# Patient Record
Sex: Male | Born: 1970 | Race: Black or African American | Hispanic: No | Marital: Married | Smoking: Current some day smoker
Health system: Southern US, Community
[De-identification: ages and names within clinical notes are randomized; demographics above are authoritative.]

## PROBLEM LIST (undated history)

## (undated) DIAGNOSIS — N452 Orchitis: Secondary | ICD-10-CM

## (undated) DIAGNOSIS — D619 Aplastic anemia, unspecified: Secondary | ICD-10-CM

## (undated) HISTORY — DX: Aplastic anemia, unspecified: D61.9

## (undated) HISTORY — DX: Orchitis: N45.2

---

## 1998-04-01 ENCOUNTER — Emergency Department (HOSPITAL_COMMUNITY): Admission: EM | Admit: 1998-04-01 | Discharge: 1998-04-01 | Payer: Self-pay | Admitting: Emergency Medicine

## 1999-03-15 ENCOUNTER — Emergency Department (HOSPITAL_COMMUNITY): Admission: EM | Admit: 1999-03-15 | Discharge: 1999-03-15 | Payer: Self-pay | Admitting: Emergency Medicine

## 2004-12-04 ENCOUNTER — Ambulatory Visit: Payer: Self-pay | Admitting: Oncology

## 2005-12-01 ENCOUNTER — Ambulatory Visit: Payer: Self-pay | Admitting: Oncology

## 2006-09-02 ENCOUNTER — Ambulatory Visit: Payer: Self-pay | Admitting: Oncology

## 2006-11-27 ENCOUNTER — Ambulatory Visit: Payer: Self-pay | Admitting: Oncology

## 2006-12-02 LAB — CBC WITH DIFFERENTIAL/PLATELET
BASO%: 0.4 % (ref 0.0–2.0)
Basophils Absolute: 0 10*3/uL (ref 0.0–0.1)
EOS%: 0.4 % (ref 0.0–7.0)
HCT: 47.8 % (ref 38.7–49.9)
HGB: 16.3 g/dL (ref 13.0–17.1)
LYMPH%: 24 % (ref 14.0–48.0)
MCH: 32.9 pg (ref 28.0–33.4)
MCHC: 34 g/dL (ref 32.0–35.9)
MCV: 96.9 fL (ref 81.6–98.0)
MONO%: 5.5 % (ref 0.0–13.0)
NEUT%: 69.7 % (ref 40.0–75.0)
Platelets: 298 10*3/uL (ref 145–400)

## 2007-04-04 ENCOUNTER — Ambulatory Visit: Payer: Self-pay | Admitting: Oncology

## 2007-04-04 LAB — MORPHOLOGY

## 2007-04-04 LAB — CBC WITH DIFFERENTIAL/PLATELET
Basophils Absolute: 0 10*3/uL (ref 0.0–0.1)
Eosinophils Absolute: 0.1 10*3/uL (ref 0.0–0.5)
HGB: 15.6 g/dL (ref 13.0–17.1)
LYMPH%: 33.6 % (ref 14.0–48.0)
MCV: 93.6 fL (ref 81.6–98.0)
MONO%: 9.2 % (ref 0.0–13.0)
NEUT#: 3.4 10*3/uL (ref 1.5–6.5)
Platelets: 294 10*3/uL (ref 145–400)

## 2007-11-30 ENCOUNTER — Ambulatory Visit: Payer: Self-pay | Admitting: Oncology

## 2008-01-12 ENCOUNTER — Ambulatory Visit: Payer: Self-pay | Admitting: Oncology

## 2008-01-16 LAB — MORPHOLOGY
PLT EST: ADEQUATE
RBC Comments: NORMAL

## 2008-01-16 LAB — CBC WITH DIFFERENTIAL/PLATELET
Basophils Absolute: 0 10*3/uL (ref 0.0–0.1)
EOS%: 0.6 % (ref 0.0–7.0)
Eosinophils Absolute: 0 10*3/uL (ref 0.0–0.5)
HGB: 16 g/dL (ref 13.0–17.1)
MCH: 31.1 pg (ref 28.0–33.4)
MONO%: 7.3 % (ref 0.0–13.0)
NEUT#: 4.3 10*3/uL (ref 1.5–6.5)
RBC: 5.13 10*6/uL (ref 4.20–5.71)
RDW: 13.6 % (ref 11.2–14.6)
lymph#: 1.5 10*3/uL (ref 0.9–3.3)

## 2008-01-16 LAB — COMPREHENSIVE METABOLIC PANEL
ALT: 21 U/L (ref 0–53)
AST: 17 U/L (ref 0–37)
Alkaline Phosphatase: 70 U/L (ref 39–117)
BUN: 12 mg/dL (ref 6–23)
Calcium: 9.4 mg/dL (ref 8.4–10.5)
Chloride: 105 mEq/L (ref 96–112)
Creatinine, Ser: 1.25 mg/dL (ref 0.40–1.50)

## 2009-01-11 ENCOUNTER — Ambulatory Visit: Payer: Self-pay | Admitting: Oncology

## 2009-05-16 ENCOUNTER — Ambulatory Visit: Payer: Self-pay | Admitting: Oncology

## 2009-05-20 LAB — CBC WITH DIFFERENTIAL/PLATELET
Basophils Absolute: 0 10*3/uL (ref 0.0–0.1)
EOS%: 0.5 % (ref 0.0–7.0)
HCT: 46.3 % (ref 38.4–49.9)
HGB: 16 g/dL (ref 13.0–17.1)
LYMPH%: 27.6 % (ref 14.0–49.0)
MCH: 33.4 pg (ref 27.2–33.4)
MCV: 96.3 fL (ref 79.3–98.0)
MONO%: 8.4 % (ref 0.0–14.0)
NEUT%: 63.1 % (ref 39.0–75.0)

## 2009-05-20 LAB — COMPREHENSIVE METABOLIC PANEL
AST: 16 U/L (ref 0–37)
Albumin: 4.2 g/dL (ref 3.5–5.2)
BUN: 9 mg/dL (ref 6–23)
Calcium: 9.2 mg/dL (ref 8.4–10.5)
Chloride: 103 mEq/L (ref 96–112)
Glucose, Bld: 91 mg/dL (ref 70–99)
Potassium: 3.7 mEq/L (ref 3.5–5.3)

## 2009-05-20 LAB — LIPID PANEL
LDL Cholesterol: 155 mg/dL — ABNORMAL HIGH (ref 0–99)
Total CHOL/HDL Ratio: 3.8 Ratio
VLDL: 12 mg/dL (ref 0–40)

## 2010-05-16 ENCOUNTER — Ambulatory Visit: Payer: Self-pay | Admitting: Oncology

## 2010-05-20 LAB — CBC WITH DIFFERENTIAL/PLATELET
BASO%: 0.5 % (ref 0.0–2.0)
Basophils Absolute: 0 10*3/uL (ref 0.0–0.1)
EOS%: 0.7 % (ref 0.0–7.0)
HGB: 15.8 g/dL (ref 13.0–17.1)
MCH: 33.6 pg — ABNORMAL HIGH (ref 27.2–33.4)
MCHC: 34.6 g/dL (ref 32.0–36.0)
MCV: 97.1 fL (ref 79.3–98.0)
MONO%: 7.7 % (ref 0.0–14.0)
RBC: 4.71 10*6/uL (ref 4.20–5.82)
RDW: 13.7 % (ref 11.0–14.6)

## 2010-05-20 LAB — COMPREHENSIVE METABOLIC PANEL
Albumin: 3.9 g/dL (ref 3.5–5.2)
BUN: 11 mg/dL (ref 6–23)
CO2: 29 mEq/L (ref 19–32)
Glucose, Bld: 104 mg/dL — ABNORMAL HIGH (ref 70–99)
Sodium: 138 mEq/L (ref 135–145)
Total Bilirubin: 1 mg/dL (ref 0.3–1.2)
Total Protein: 7.2 g/dL (ref 6.0–8.3)

## 2010-05-20 LAB — LIPID PANEL
Cholesterol: 214 mg/dL — ABNORMAL HIGH (ref 0–200)
Triglycerides: 42 mg/dL (ref <150)

## 2010-05-20 LAB — MORPHOLOGY

## 2012-03-30 DIAGNOSIS — N452 Orchitis: Secondary | ICD-10-CM

## 2012-03-30 HISTORY — DX: Orchitis: N45.2

## 2012-04-25 ENCOUNTER — Emergency Department (HOSPITAL_COMMUNITY): Payer: Managed Care, Other (non HMO)

## 2012-04-25 ENCOUNTER — Emergency Department (HOSPITAL_COMMUNITY)
Admission: EM | Admit: 2012-04-25 | Discharge: 2012-04-25 | Disposition: A | Discharge status: Home / Self Care | Payer: Managed Care, Other (non HMO) | Attending: Emergency Medicine | Admitting: Emergency Medicine

## 2012-04-25 ENCOUNTER — Encounter (HOSPITAL_COMMUNITY): Payer: Self-pay | Admitting: Emergency Medicine

## 2012-04-25 DIAGNOSIS — F17200 Nicotine dependence, unspecified, uncomplicated: Secondary | ICD-10-CM | POA: Insufficient documentation

## 2012-04-25 DIAGNOSIS — N453 Epididymo-orchitis: Secondary | ICD-10-CM | POA: Insufficient documentation

## 2012-04-25 LAB — URINALYSIS, ROUTINE W REFLEX MICROSCOPIC
Ketones, ur: NEGATIVE mg/dL
Nitrite: NEGATIVE
Specific Gravity, Urine: 1.026 (ref 1.005–1.030)
Urobilinogen, UA: 1 mg/dL (ref 0.0–1.0)
pH: 7.5 (ref 5.0–8.0)

## 2012-04-25 LAB — URINE MICROSCOPIC-ADD ON

## 2012-04-25 MED ORDER — CEFTRIAXONE SODIUM 250 MG IJ SOLR
250.0000 mg | Freq: Once | INTRAMUSCULAR | Status: AC
  Administered 2012-04-25: 250 mg via INTRAMUSCULAR
  Filled 2012-04-25: qty 250

## 2012-04-25 MED ORDER — LEVOFLOXACIN 750 MG PO TABS: 750.0000 mg | ORAL_TABLET | Freq: Every day | ORAL | Status: DC

## 2012-04-25 MED ORDER — OXYCODONE-ACETAMINOPHEN 5-325 MG PO TABS: 1.0000 | ORAL_TABLET | ORAL | Status: AC | PRN

## 2012-04-25 MED ORDER — LIDOCAINE HCL 1 % IJ SOLN
INTRAMUSCULAR | Status: AC
  Administered 2012-04-25: 20 mL
  Filled 2012-04-25: qty 20

## 2012-04-25 MED ORDER — LEVOFLOXACIN 500 MG PO TABS
750.0000 mg | ORAL_TABLET | Freq: Every day | ORAL | Status: DC
  Administered 2012-04-25: 750 mg via ORAL
  Filled 2012-04-25: qty 2

## 2012-04-25 MED ORDER — DOXYCYCLINE HYCLATE 100 MG PO TABS
100.0000 mg | ORAL_TABLET | Freq: Once | ORAL | Status: AC
  Administered 2012-04-25: 100 mg via ORAL
  Filled 2012-04-25: qty 1

## 2012-04-25 NOTE — Discharge Instructions (Signed)
Orchitis Orchitis is an infection of the testicle of usually sudden onset (happens quickly). It may be viral or bacterial (caused by germs). Usually with this illness there is generalized malaise (not feeling well) and fever. There is also pain. There is usually tenderness and swelling of the scrotum and testicle. DIAGNOSIS  Your caregiver will perform an exam to make sure there is not another reason for the pain in your testicle. A rectal exam may be done to find out if the prostate is swollen and tender. Blood work may be done to see if your white blood cell count is elevated. This can help determine if an infection is viral or bacterial. A urinalysis can also determine what type of infection is present. Most bacterial infections can be treated with antibiotics (medications which kill germs). LET YOUR CAREGIVER KNOW ABOUT:  Allergies.   Medications taken including herbs, eye drops, over the counter medications, and creams.   Use of steroids (by mouth or creams).   Previous problems with anesthetics or novocaine.   Previous prostate infections.   History of blood clots (thrombophlebitis).   History of bleeding or blood problems.   Previous surgery.   Previous urinary tract infection.   Other health problems.  HOME CARE INSTRUCTIONS   Apply cold packs to the scrotal area for twenty minutes, four times per day or as needed.   A scrotal support may be helpful. Keep a small pillow or support under your testicles while lying or sitting down.   Only take over-the-counter or prescription medicines for pain, discomfort, or fever as directed by your caregiver.   Take all medications, including antibiotics, as directed. Take the antibiotics for the full prescribed length of time even if you are feeling better.  SEEK IMMEDIATE MEDICAL CARE IF:   Your redness, swelling, or pain in the testicle increases or is not getting better.   You have a fever.   You have pain not relieved with  medicines.   You have any worsening of any symptoms (problems) that originally brought you in for medical care.  Document Released: 11/13/2000 Document Revised: 11/05/2011 Document Reviewed: 11/16/2005 [facility] Patient Information 2012 [place], [place].  Please take all antibiotics as prescribed. Use pain medicine as needed. Call Dr. Ellin Goodie office tomorrow morning for followup this week.

## 2012-04-25 NOTE — ED Notes (Signed)
Pt returned from [place]. Encouraged to provide urine specimen.

## 2012-04-25 NOTE — ED Notes (Signed)
Pt was lifting 4 days ago and had sudden lower abdominal pain to testicles. States has "lump" on right testicle.

## 2012-04-26 LAB — GC/CHLAMYDIA PROBE AMP, GENITAL: Chlamydia, DNA Probe: NEGATIVE

## 2012-04-27 ENCOUNTER — Emergency Department (HOSPITAL_COMMUNITY)
Admission: EM | Admit: 2012-04-27 | Discharge: 2012-04-27 | Disposition: A | Discharge status: Home / Self Care | Payer: Managed Care, Other (non HMO) | Attending: Emergency Medicine | Admitting: Emergency Medicine

## 2012-04-27 ENCOUNTER — Encounter (HOSPITAL_COMMUNITY): Payer: Self-pay | Admitting: Emergency Medicine

## 2012-04-27 DIAGNOSIS — Z0289 Encounter for other administrative examinations: Secondary | ICD-10-CM | POA: Insufficient documentation

## 2012-04-27 DIAGNOSIS — N453 Epididymo-orchitis: Secondary | ICD-10-CM | POA: Insufficient documentation

## 2012-04-27 DIAGNOSIS — F17200 Nicotine dependence, unspecified, uncomplicated: Secondary | ICD-10-CM | POA: Insufficient documentation

## 2012-04-27 DIAGNOSIS — N452 Orchitis: Secondary | ICD-10-CM

## 2012-04-27 NOTE — ED Notes (Signed)
Pt presenting to ed with c/o seen here on Monday for infection in his testicles. Pt states he went back to work today but was unable to perform his job and he needs a note to be out for the rest of the week. 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 Pt is alert and oriented at this time

## 2012-04-27 NOTE — Discharge Instructions (Signed)
Orchitis Orchitis is an infection of the testicle of usually sudden onset (happens quickly). It may be viral or bacterial (caused by germs). Usually with this illness there is generalized malaise (not feeling well) and fever. There is also pain. There is usually tenderness and swelling of the scrotum and testicle. DIAGNOSIS  Your caregiver will perform an exam to make sure there is not another reason for the pain in your testicle. A rectal exam may be done to find out if the prostate is swollen and tender. Blood work may be done to see if your white blood cell count is elevated. This can help determine if an infection is viral or bacterial. A urinalysis can also determine what type of infection is present. Most bacterial infections can be treated with antibiotics (medications which kill germs). LET YOUR CAREGIVER KNOW ABOUT:  Allergies.   Medications taken including herbs, eye drops, over the counter medications, and creams.   Use of steroids (by mouth or creams).   Previous problems with anesthetics or novocaine.   Previous prostate infections.   History of blood clots (thrombophlebitis).   History of bleeding or blood problems.   Previous surgery.   Previous urinary tract infection.   Other health problems.  HOME CARE INSTRUCTIONS   Apply cold packs to the scrotal area for twenty minutes, four times per day or as needed.   A scrotal support may be helpful. Keep a small pillow or support under your testicles while lying or sitting down.   Only take over-the-counter or prescription medicines for pain, discomfort, or fever as directed by your caregiver.   Take all medications, including antibiotics, as directed. Take the antibiotics for the full prescribed length of time even if you are feeling better.  SEEK IMMEDIATE MEDICAL CARE IF:   Your redness, swelling, or pain in the testicle increases or is not getting better.   You have a fever.   You have pain not relieved with  medicines.   You have any worsening of any symptoms (problems) that originally brought you in for medical care.  Document Released: 11/13/2000 Document Revised: 11/05/2011 Document Reviewed: 11/16/2005 [employer] Patient Information 2012 [employer]. 

## 2012-04-27 NOTE — ED Provider Notes (Signed)
History     CSN: [id number]  Arrival date & time 04/27/12  0930   First MD Initiated Contact with Patient 04/27/12 [phone]      Chief Complaint  Patient presents with  . Re-evaluation  . Letter for School/Work    (Consider location/radiation/quality/duration/timing/severity/associated sxs/prior treatment) HPI  41 year old male presents requesting for note. Patient was recently diagnosed with having epidydimo-orchitis 2 days ago and was given Levaquin as treatment. Pt went back to work today but was unable to perform his work due to increasing testicular pain with heavy lifting. He does admits to perform heavy lifting at his [employer] job. Denies fever, rash, penile discharge. His symptoms is improving otherwise.  History reviewed. No pertinent past medical history.  No past surgical history on file.  No family history on file.  History  Substance Use Topics  . Smoking status: Current Some Day Smoker    Types: Cigars  . Smokeless tobacco: Not on file  . Alcohol Use: Yes     occasionally      Review of Systems  Constitutional: Negative for fever.  Cardiovascular: Negative for leg swelling.  Genitourinary: Positive for scrotal swelling and testicular pain. Negative for flank pain and penile pain.  Skin: Negative for rash.    Allergies  Review of patient's allergies indicates no known allergies.  Home Medications   Current Outpatient Rx  Name Route Sig Dispense Refill  . DICLOFENAC SODIUM 75 MG PO TBEC Oral Take 75 mg by mouth 2 (two) times daily as needed. For pain.    Marland Kitchen LEVOFLOXACIN 750 MG PO TABS Oral Take 750 mg by mouth daily. PT'S ON DAY 2 OF THERAPY    . ADULT MULTIVITAMIN W/MINERALS CH Oral Take 1 tablet by mouth daily.    . OXYCODONE-ACETAMINOPHEN 5-325 MG PO TABS Oral Take 1 tablet by mouth every 4 (four) hours as needed for pain. 15 tablet 0    BP 120/65  Pulse 93  Temp(Src) 99.2 F (37.3 C) (Oral)  Resp 20  SpO2 99%  Physical Exam  Nursing note  and vitals reviewed. Constitutional: He appears well-developed and well-nourished. No distress.  HENT:  Head: Atraumatic.  Eyes: Conjunctivae are normal.  Neck: Normal range of motion. Neck supple.  Abdominal: Soft. There is no tenderness. Hernia confirmed negative in the right inguinal area and confirmed negative in the left inguinal area.  Genitourinary: Penis normal. Right testis shows swelling and tenderness. Left testis shows swelling and tenderness.  Musculoskeletal: Normal range of motion.  Neurological: He is alert.  Skin: Skin is warm. No rash noted.    ED Course  Procedures (including critical care time)  Labs Reviewed - No data to display US Scrotum  04/25/2012  *RADIOLOGY REPORT*  Clinical Data:  Bilateral testicular swelling and pain.  SCROTAL ULTRASOUND DOPPLER ULTRASOUND OF THE TESTICLES  Technique: Complete ultrasound examination of the testicles, epididymis, and other scrotal structures was performed.  Color and spectral Doppler ultrasound were also utilized to evaluate blood flow to the testicles.  Comparison:  None  Findings:  Right testis:  4.3 x 2.2 x 2.9 cm.  Increased vascularity with microlithiasis.  Left testis:  3.9 x 2.8 x 3.0 cm.  Increased vascularity.  Right epididymis: Very prominent and heterogeneous with increased vascularity.  Left epididymis:  Very prominent and heterogeneous with increased vascularity.  Hydocele:  Small bilateral hydroceles.  Varicocele:  No varicoceles.  Pulsed Doppler interrogation of both testes demonstrates low resistance flow bilaterally.  The scan of the scrotum is  thickened.  IMPRESSION: Findings of bilateral epididymo-orchitis.  No abscesses at this time.  No evidence of testicular torsion.  Original Report Authenticated By: Gwynn Burly, M.D.   [place] Art/ven Flow Abd Pelv Doppler  04/25/2012  *RADIOLOGY REPORT*  Clinical Data:  Bilateral testicular swelling and pain.  SCROTAL ULTRASOUND DOPPLER ULTRASOUND OF THE TESTICLES  Technique:  Complete ultrasound examination of the testicles, epididymis, and other scrotal structures was performed.  Color and spectral Doppler ultrasound were also utilized to evaluate blood flow to the testicles.  Comparison:  None  Findings:  Right testis:  4.3 x 2.2 x 2.9 cm.  Increased vascularity with microlithiasis.  Left testis:  3.9 x 2.8 x 3.0 cm.  Increased vascularity.  Right epididymis: Very prominent and heterogeneous with increased vascularity.  Left epididymis:  Very prominent and heterogeneous with increased vascularity.  Hydocele:  Small bilateral hydroceles.  Varicocele:  No varicoceles.  Pulsed Doppler interrogation of both testes demonstrates low resistance flow bilaterally.  The scan of the scrotum is thickened.  IMPRESSION: Findings of bilateral epididymo-orchitis.  No abscesses at this time.  No evidence of testicular torsion.  Original Report Authenticated By: Gwynn Burly, M.D.     No diagnosis found.    MDM  Pt request for work note to be out for this week due to epidydimo-orchitis.  Sts Levaquin has helped significantly.  On exam, scrotal edematous, tender to palpation. Otherwise, no sig findings.  Afebrile.    Will offer work note.     Vital signs and medical records were reviewed and considered.  Labs and imaging were reviewed by me.      Fayrene Helper, PA-C 04/27/12 1003  Fayrene Helper, PA-C 04/27/12 1010

## 2012-04-27 NOTE — ED Notes (Signed)
Pt presenting to ed with c/o needing a note for the rest of the week for work. Pt states he attempted to return to work today and could not perform his duties.

## 2012-04-28 NOTE — ED Provider Notes (Signed)
Medical screening examination/treatment/procedure(s) were performed by non-physician practitioner and as supervising physician I was immediately available for consultation/collaboration.    Stancil L Lavi Sheehan, MD 04/28/12 0709 

## 2012-05-05 ENCOUNTER — Ambulatory Visit: Payer: Self-pay | Admitting: Oncology

## 2012-05-05 ENCOUNTER — Other Ambulatory Visit: Payer: Self-pay | Admitting: Lab

## 2012-05-09 NOTE — ED Provider Notes (Signed)
History     CSN: [id number]  Arrival date & time 04/25/12  1004   First MD Initiated Contact with Patient 04/25/12 1009      Chief Complaint  Patient presents with  . Testicle Pain    (Consider location/radiation/quality/duration/timing/severity/associated sxs/prior treatment) HPI  Patient complaining of pain and swelling of right testicle. This has been present for 4 days and is worsening. He has not had fever or chills. He denies urethral discharge or exposure to STDs. He has not any urinary tract infection symptoms. He has not had similar symptoms in the past. Pain is constant in nature without radiation it is a throbbing quality present for 4 days and is moderate to severe. He has no associated symptoms or prior treatment the  History reviewed. No pertinent past medical history.  History reviewed. No pertinent past surgical history.  History reviewed. No pertinent family history.  History  Substance Use Topics  . Smoking status: Current Some Day Smoker    Types: Cigars  . Smokeless tobacco: Not on file  . Alcohol Use: Yes     occasionally      Review of Systems  All other systems reviewed and are negative.    Allergies  Review of patient's allergies indicates no known allergies.  Home Medications   Current Outpatient Rx  Name Route Sig Dispense Refill  . DICLOFENAC SODIUM 75 MG PO TBEC Oral Take 75 mg by mouth 2 (two) times daily as needed. For pain.    . ADULT MULTIVITAMIN W/MINERALS CH Oral Take 1 tablet by mouth daily.      BP 108/62  Pulse 94  Temp(Src) 99.3 F (37.4 C) (Oral)  Resp 18  SpO2 99%  Physical Exam  Nursing note and vitals reviewed. Constitutional: He appears well-developed and well-nourished.  HENT:  Head: Normocephalic and atraumatic.  Eyes: Conjunctivae are normal. Pupils are equal, round, and reactive to light.  Neck: Normal range of motion. Neck supple.  Cardiovascular: Normal rate.   Abdominal: Hernia confirmed negative in  the right inguinal area and confirmed negative in the left inguinal area.  Genitourinary: Prostate normal and penis normal. Right testis shows swelling and tenderness. Left testis shows swelling and tenderness.    ED Course  Procedures (including critical care time)  Labs Reviewed  URINALYSIS, ROUTINE W REFLEX MICROSCOPIC - Abnormal; Notable for the following:    APPearance CLOUDY (*)    Protein, ur 30 (*)    Leukocytes, UA SMALL (*)    All other components within normal limits  GC/CHLAMYDIA PROBE AMP, GENITAL  URINE MICROSCOPIC-ADD ON  URINE CULTURE  LAB REPORT - SCANNED   No results found.   1. Epididymo-orchitis       MDM  Patient to be discharged home on antibiotics. Discussed the patient's care with urology and he will followup in the next 3-4 days.       Hilario Quarry, MD 05/09/12 [id number]

## 2012-05-26 ENCOUNTER — Telehealth: Payer: Self-pay | Admitting: *Deleted

## 2012-05-30 ENCOUNTER — Ambulatory Visit (HOSPITAL_BASED_OUTPATIENT_CLINIC_OR_DEPARTMENT_OTHER): Payer: Managed Care, Other (non HMO) | Admitting: Oncology

## 2012-05-30 ENCOUNTER — Telehealth: Payer: Self-pay | Admitting: *Deleted

## 2012-05-30 ENCOUNTER — Encounter: Payer: Self-pay | Admitting: Oncology

## 2012-05-30 VITALS — BP 121/72 | HR 82 | Temp 98.9°F | Ht 67.5 in | Wt 157.4 lb

## 2012-05-30 DIAGNOSIS — N453 Epididymo-orchitis: Secondary | ICD-10-CM

## 2012-05-30 DIAGNOSIS — N452 Orchitis: Secondary | ICD-10-CM

## 2012-05-30 DIAGNOSIS — D619 Aplastic anemia, unspecified: Secondary | ICD-10-CM

## 2012-05-30 NOTE — Telephone Encounter (Signed)
Gave patient appointment for lab only on 06-06-2012 gave patient appointment for 05-23-2013 lab only appointment 05-30-2013 at 4:30pm per patient request gave ebony the patient's fmla paperwork also

## 2012-05-30 NOTE — Progress Notes (Signed)
ID: Ria Clock   DOB: 1971/09/15  MR#: [id number]  WUJ#:[id number]  INTERVAL HISTORY: Karen Kitchens returns today for followup of his remote aplastic anemia. The interval history is generally unremarkable.  REVIEW OF SYSTEMS:  He was seen in the emergency room for orchitis, and treated with herbal care and and Levaquin. He is feeling fine right now. He has had no bleeding, fever, unexplained fatigue or weight loss, or other systemic symptoms since the last visit here. A detailed review of systems was otherwise noncontributory  PAST MEDICAL HISTORY: Past Medical History  Diagnosis Date  . Aplastic anemia, unspecified     PAST SURGICAL HISTORY: No past surgical history on file.  FAMILY HISTORY No family history on file.  his father died 12-10-11 from complications of Alzheimer's disease. His mother is now 51 years old. He has a half-brother who is in his early 38s. He has no sisters. There is no history of cancer or other blood problems in the family.  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 His wife of 10 years, Ephriam Knuckles, works in [place]. Their 2 daughters are [place] and [place]. The patient attends [employer]. [employer]   ADVANCED DIRECTIVES:  HEALTH MAINTENANCE: History  Substance Use Topics  . Smoking status: Current Some Day Smoker    Types: Cigars  . Smokeless tobacco: Not on file  . Alcohol Use: Yes     occasionally     Colonoscopy:  PSA:  Bone density:  Lipid panel:  No Known Allergies  Current Outpatient Prescriptions  Medication Sig Dispense Refill  . Multiple Vitamin (MULITIVITAMIN WITH MINERALS) TABS Take 1 tablet by mouth daily.      . diclofenac (VOLTAREN) 75 MG EC tablet Take 75 mg by mouth 2 (two) times daily as needed. For pain.        OBJECTIVE: Young-appearing African American male in no acute distress Filed Vitals:   05/30/12 1637  BP: 121/72  Pulse: 82  Temp: 98.9 F (37.2 C)     Body mass index  is 24.29 kg/(m^2).    ECOG FS: 0  Sclerae unicteric Oropharynx clear No cervical or supraclavicular adenopathy Lungs no rales or rhonchi Heart regular rate and rhythm Abd benign MSK no focal spinal tenderness, no peripheral edema Neuro: nonfocal  LAB RESULTS: Lab Results  Component Value Date   WBC 5.7 05/20/2010   NEUTROABS 3.6 05/20/2010   HGB 15.8 05/20/2010   HCT 45.7 05/20/2010   MCV 97.1 05/20/2010   PLT 288 05/20/2010      Chemistry      Component Value Date/Time   NA 138 05/20/2010 1113   K 3.4* 05/20/2010 1113   CL 105 05/20/2010 1113   CO2 29 05/20/2010 1113   BUN 11 05/20/2010 1113   CREATININE 1.46 05/20/2010 1113      Component Value Date/Time   CALCIUM 9.3 05/20/2010 1113   ALKPHOS 58 05/20/2010 1113   AST 28 05/20/2010 1113   ALT 29 05/20/2010 1113   BILITOT 1.0 05/20/2010 1113       No results found for this basename: LABCA2    No components found with this basename: [id number]    No results found for this basename: INR:1;PROTIME:1 in the last 168 hours  Urinalysis    Component Value Date/Time   COLORURINE YELLOW 04/25/2012 1150   APPEARANCEUR CLOUDY* 04/25/2012 1150   LABSPEC 1.026 04/25/2012 1150   PHURINE 7.5 04/25/2012 1150   GLUCOSEU NEGATIVE 04/25/2012  1150   HGBUR NEGATIVE 04/25/2012 1150   BILIRUBINUR NEGATIVE 04/25/2012 1150   KETONESUR NEGATIVE 04/25/2012 1150   PROTEINUR 30* 04/25/2012 1150   UROBILINOGEN 1.0 04/25/2012 1150   NITRITE NEGATIVE 04/25/2012 1150   LEUKOCYTESUR SMALL* 04/25/2012 1150    STUDIES: SCROTAL ULTRASOUND  DOPPLER ULTRASOUND OF THE TESTICLES  Technique: Complete ultrasound examination of the testicles,  epididymis, and other scrotal structures was performed. Color and  spectral Doppler ultrasound were also utilized to evaluate blood  flow to the testicles.  Comparison: None  Findings:  Right testis: 4.3 x 2.2 x 2.9 cm. Increased vascularity with  microlithiasis.  Left testis: 3.9 x 2.8 x 3.0 cm. Increased vascularity.    Right epididymis: Very prominent and heterogeneous with increased  vascularity.  Left epididymis: Very prominent and heterogeneous with increased  vascularity.  Hydocele: Small bilateral hydroceles.  Varicocele: No varicoceles.  Pulsed Doppler interrogation of both testes demonstrates low  resistance flow bilaterally.  The scan of the scrotum is thickened.  IMPRESSION:  Findings of bilateral epididymo-orchitis. No abscesses at this  time. No evidence of testicular torsion.  Original Report Authenticated By: Gwynn Burly, M.D.    ASSESSMENT: 41 y.o. with a history of aplastic anemia diagnosed March of 1994, in continuing remission.   PLAN: we're going to obtain a lipid panel next Monday, which is a day he has off and therefore is able to come early to get his labs. I offered to get him a primary care physician and he will let been know if he would like me to do that. Otherwise she will see me again in one year. He knows to call for any problems that may develop before then.   Samiyyah Moffa C    05/30/2012

## 2012-05-31 ENCOUNTER — Encounter: Payer: Self-pay | Admitting: Oncology

## 2012-05-31 NOTE — Progress Notes (Signed)
Put fmla papers on nurse's desk °

## 2012-06-06 ENCOUNTER — Other Ambulatory Visit (HOSPITAL_BASED_OUTPATIENT_CLINIC_OR_DEPARTMENT_OTHER): Payer: Managed Care, Other (non HMO) | Admitting: Lab

## 2012-06-06 DIAGNOSIS — D619 Aplastic anemia, unspecified: Secondary | ICD-10-CM

## 2012-06-06 LAB — CBC WITH DIFFERENTIAL/PLATELET
Basophils Absolute: 0 10*3/uL (ref 0.0–0.1)
Eosinophils Absolute: 0.1 10*3/uL (ref 0.0–0.5)
HCT: 44.8 % (ref 38.4–49.9)
LYMPH%: 32.3 % (ref 14.0–49.0)
MONO#: 0.5 10*3/uL (ref 0.1–0.9)
NEUT#: 3.1 10*3/uL (ref 1.5–6.5)
NEUT%: 57.2 % (ref 39.0–75.0)
Platelets: 267 10*3/uL (ref 140–400)
WBC: 5.4 10*3/uL (ref 4.0–10.3)

## 2012-06-06 LAB — COMPREHENSIVE METABOLIC PANEL
CO2: 28 mEq/L (ref 19–32)
Creatinine, Ser: 1.22 mg/dL (ref 0.50–1.35)
Glucose, Bld: 96 mg/dL (ref 70–99)
Total Bilirubin: 0.6 mg/dL (ref 0.3–1.2)
Total Protein: 7 g/dL (ref 6.0–8.3)

## 2012-06-06 LAB — LIPID PANEL
Cholesterol: 213 mg/dL — ABNORMAL HIGH (ref 0–200)
Total CHOL/HDL Ratio: 3.2 Ratio
Triglycerides: 66 mg/dL (ref <150)
VLDL: 13 mg/dL (ref 0–40)

## 2012-06-15 NOTE — Telephone Encounter (Signed)
No entry noted

## 2012-07-26 ENCOUNTER — Telehealth: Payer: Self-pay | Admitting: Medical Oncology

## 2012-07-26 NOTE — Telephone Encounter (Signed)
LMOVM, per MD, labs looked perfect except for LCL.  Dr. Darnelle Catalan suggests no fried foods, decreasing red meat intake, and Dr. Darnelle Catalan will see you back in one year.  Instructed patient to call with any questions or concerns.

## 2013-05-22 ENCOUNTER — Other Ambulatory Visit: Payer: Self-pay | Admitting: *Deleted

## 2013-05-22 DIAGNOSIS — D619 Aplastic anemia, unspecified: Secondary | ICD-10-CM

## 2013-05-23 ENCOUNTER — Other Ambulatory Visit (HOSPITAL_BASED_OUTPATIENT_CLINIC_OR_DEPARTMENT_OTHER): Payer: Managed Care, Other (non HMO) | Admitting: Lab

## 2013-05-23 DIAGNOSIS — D619 Aplastic anemia, unspecified: Secondary | ICD-10-CM

## 2013-05-23 LAB — COMPREHENSIVE METABOLIC PANEL (CC13)
AST: 18 U/L (ref 5–34)
Albumin: 3.7 g/dL (ref 3.5–5.0)
Alkaline Phosphatase: 77 U/L (ref 40–150)
BUN: 10.1 mg/dL (ref 7.0–26.0)
Calcium: 9.4 mg/dL (ref 8.4–10.4)
Creatinine: 1.2 mg/dL (ref 0.7–1.3)
Potassium: 3.7 mEq/L (ref 3.5–5.1)
Total Bilirubin: 0.34 mg/dL (ref 0.20–1.20)
Total Protein: 7.3 g/dL (ref 6.4–8.3)

## 2013-05-23 LAB — CBC WITH DIFFERENTIAL/PLATELET
Basophils Absolute: 0 10*3/uL (ref 0.0–0.1)
EOS%: 0.9 % (ref 0.0–7.0)
HGB: 15.1 g/dL (ref 13.0–17.1)
LYMPH%: 24.8 % (ref 14.0–49.0)
MCH: 32.3 pg (ref 27.2–33.4)
MCV: 94.9 fL (ref 79.3–98.0)
MONO%: 7.3 % (ref 0.0–14.0)
NEUT%: 66.5 % (ref 39.0–75.0)
RDW: 13.8 % (ref 11.0–14.6)

## 2013-05-30 ENCOUNTER — Telehealth: Payer: Self-pay | Admitting: *Deleted

## 2013-05-30 ENCOUNTER — Ambulatory Visit (HOSPITAL_BASED_OUTPATIENT_CLINIC_OR_DEPARTMENT_OTHER): Payer: Managed Care, Other (non HMO) | Admitting: Oncology

## 2013-05-30 VITALS — BP 114/69 | HR 85 | Temp 97.6°F | Resp 20 | Ht 67.5 in | Wt 160.2 lb

## 2013-05-30 DIAGNOSIS — D619 Aplastic anemia, unspecified: Secondary | ICD-10-CM

## 2013-05-30 NOTE — Telephone Encounter (Signed)
appts made and printed...td 

## 2013-05-30 NOTE — Progress Notes (Signed)
ID: Hunter Jennings   DOB: 1971-07-24  MR#: [id number]  NWG#:[id number]  INTERVAL HISTORY: Karen Kitchens returns today for followup of his remote aplastic anemia. The interval history is generally unremarkable. He continues to work full-time at his very physical job where he walks all day. His daughters are now 2 and 6. His wife works for [employer].  REVIEW OF SYSTEMS: He was having some low back pain but that resolved when he got a firmer mattress. He has had no unusual headaches, visual changes, cough, phlegm production, pleurisy, change in bowel or bladder habits, chest pain or pressure, palpitations, rash, fever, or bleeding. A detailed review of systems today was entirely negative.  PAST MEDICAL HISTORY: Past Medical History  Diagnosis Date  . Aplastic anemia, unspecified   . Orchitis of both testicles may 2013    PAST SURGICAL HISTORY: No past surgical history on file.  FAMILY HISTORY No family history on file. his father died November 20, 2011 from complications of Alzheimer's disease. His mother is now 34 years old. He has a half-brother who is in his early 23s. He has no sisters. There is no history of cancer or other blood problems in the family.  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 His wife of 10 years, Ephriam Knuckles, works in [place]. Their 2 daughters are [place] and [place]. The patient attends [employer]. [employer]   ADVANCED DIRECTIVES:  HEALTH MAINTENANCE: History  Substance Use Topics  . Smoking status: Current Some Day Smoker    Types: Cigars  . Smokeless tobacco: Not on file  . Alcohol Use: Yes     Comment: occasionally     Colonoscopy:  PSA:  Bone density:  Lipid panel:  No Known Allergies  Current Outpatient Prescriptions  Medication Sig Dispense Refill  . diclofenac (VOLTAREN) 75 MG EC tablet Take 75 mg by mouth 2 (two) times daily as needed. For pain.      . Multiple Vitamin (MULITIVITAMIN WITH MINERALS) TABS Take 1  tablet by mouth daily.       No current facility-administered medications for this visit.    OBJECTIVE: Young-appearing African American male in no acute distress Filed Vitals:   05/30/13 1638  BP: 114/69  Pulse: 85  Temp: 97.6 F (36.4 C)  Resp: 20     Body mass index is 24.71 kg/(m^2).    ECOG FS: 0  Sclerae unicteric Oropharynx clear No cervical or supraclavicular adenopathy Lungs no rales or rhonchi Heart regular rate and rhythm Abd benign MSK no focal spinal tenderness, no peripheral edema Neuro: nonfocal, well oriented, pleasant affect  LAB RESULTS: Lab Results  Component Value Date   WBC 7.3 05/23/2013   NEUTROABS 4.8 05/23/2013   HGB 15.1 05/23/2013   HCT 44.3 05/23/2013   MCV 94.9 05/23/2013   PLT 289 05/23/2013      Chemistry      Component Value Date/Time   NA 138 05/23/2013 1608   NA 138 06/06/2012 0812   K 3.7 05/23/2013 1608   K 3.9 06/06/2012 0812   CL 104 05/23/2013 1608   CL 103 06/06/2012 0812   CO2 27 05/23/2013 1608   CO2 28 06/06/2012 0812   BUN 10.1 05/23/2013 1608   BUN 10 06/06/2012 0812   CREATININE 1.2 05/23/2013 1608   CREATININE 1.22 06/06/2012 0812      Component Value Date/Time   CALCIUM 9.4 05/23/2013 1608   CALCIUM 9.2 06/06/2012 0812   ALKPHOS 77 05/23/2013 1608  ALKPHOS 57 06/06/2012 0812   AST 18 05/23/2013 1608   AST 19 06/06/2012 0812   ALT 17 05/23/2013 1608   ALT 18 06/06/2012 0812   BILITOT 0.34 05/23/2013 1608   BILITOT 0.6 06/06/2012 0812       No results found for this basename: LABCA2    No components found with this basename: LABCA125    No results found for this basename: INR,  in the last 168 hours Results for ILLYA, GIENGER (MRN [id number]) as of 05/30/2013 16:39  Ref. Range 06/06/2012 08:12  Cholesterol Latest Range: 0-200 mg/dL 409 (H)  Triglycerides Latest Range: <150 mg/dL 66  HDL Latest Range: >81 mg/dL 67  LDL (calc) Latest Range: 0-99 mg/dL 191 (H)  VLDL Latest Range: 0-40 mg/dL 13  Total CHOL/HDL Ratio No range found 3.2     STUDIES: No results found.  ASSESSMENT: 42 y.o. with a history of aplastic anemia diagnosed March of 1994, treated with a brief course of steroids alone, in continuing remission.   PLAN: Reita Cliche looks fine and is very healthy. At this point, as he has turns 42, I think it would be a good idea for him to establish himself with her primary care physician, I will try to facilitate that for him. Otherwise she will see me again in one year. He knows to call for any problems that may develop before that visit.  MAGRINAT,GUSTAV C    05/30/2013

## 2013-05-31 ENCOUNTER — Telehealth: Payer: Self-pay | Admitting: *Deleted

## 2013-05-31 NOTE — Telephone Encounter (Signed)
Lm gv the info for [facility], with the number and address for the pt to get a PCP...td

## 2013-06-15 ENCOUNTER — Telehealth: Payer: Self-pay | Admitting: Oncology

## 2013-06-15 NOTE — Telephone Encounter (Signed)
Sent letter to Dr.William D. Shaw office from Dr. Darnelle Catalan

## 2013-09-06 ENCOUNTER — Telehealth: Payer: Self-pay | Admitting: *Deleted

## 2013-09-06 NOTE — Telephone Encounter (Signed)
Lm informed the pt that GCM will be out of the office on 05/31/14. gv appt for 05/28/14 to have labs@ 4pm, and ov on 06/04/14@ 4p. Made pt aware that i will mail a letter/avs...td

## 2014-02-15 IMAGING — US US ART/VEN ABD/PELV/SCROTUM DOPPLER LTD
1 series · 14 of 25 positions shown · non-contrast
Comparison: None

CLINICAL DATA: Bilateral testicular swelling and pain.

SCROTAL ULTRASOUND
DOPPLER ULTRASOUND OF THE TESTICLES
TECHNIQUE: Complete ultrasound examination of the testicles,
epididymis, and other scrotal structures was performed.  Color and
spectral Doppler ultrasound were also utilized to evaluate blood
flow to the testicles.

[Series 1: us art/ven abd/pelv/scrotum doppler ltd · 0.08mm/px · 14 of 78 slices shown]
[im 1/78]
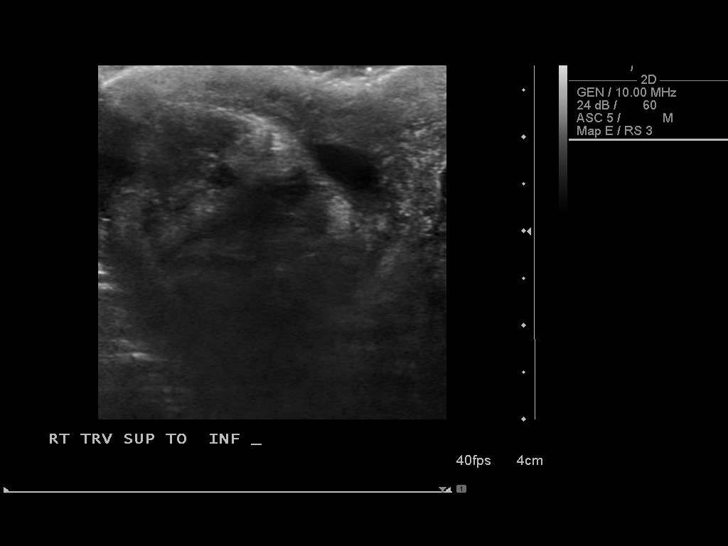
[im 7/78]
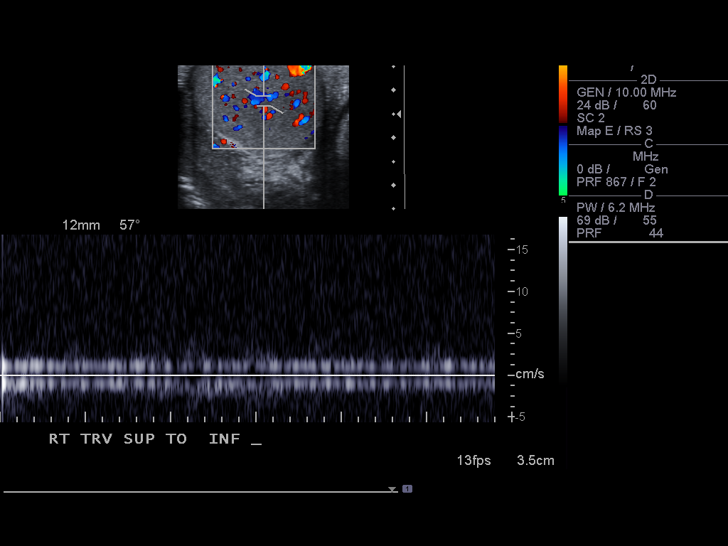
[im 13/78]
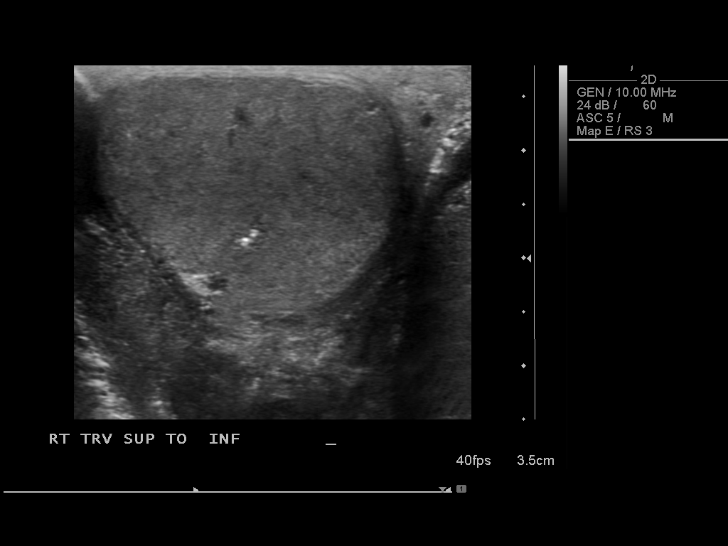
[im 20/78]
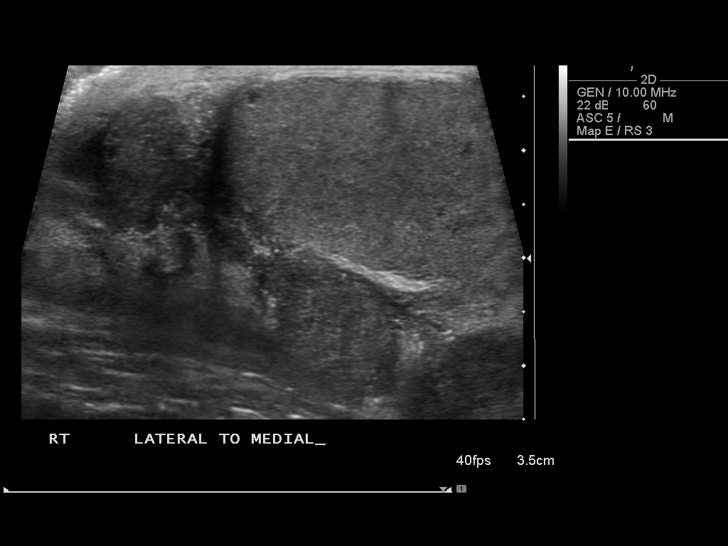
[im 26/78]
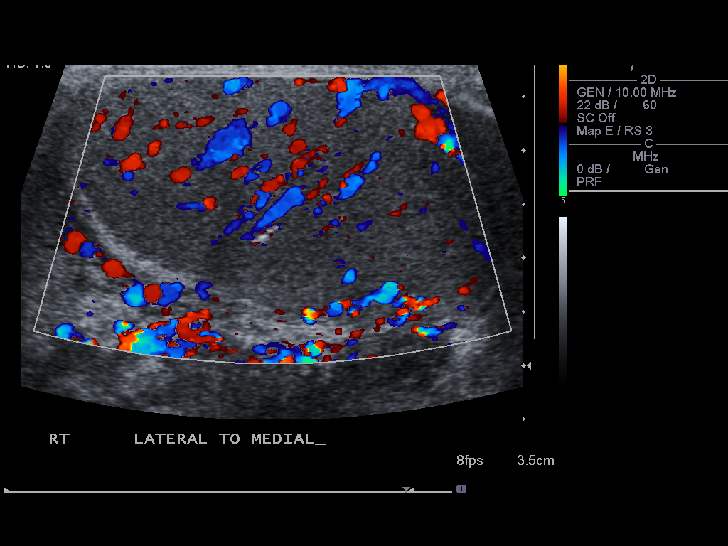
[im 29/78]
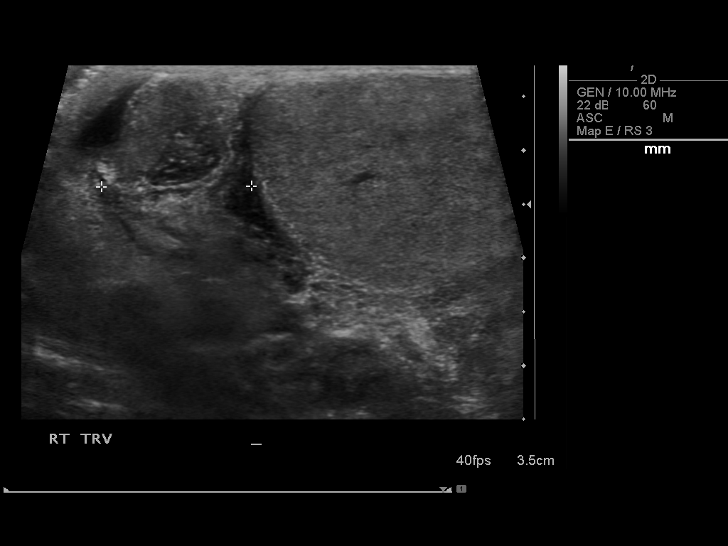
[im 36/78]
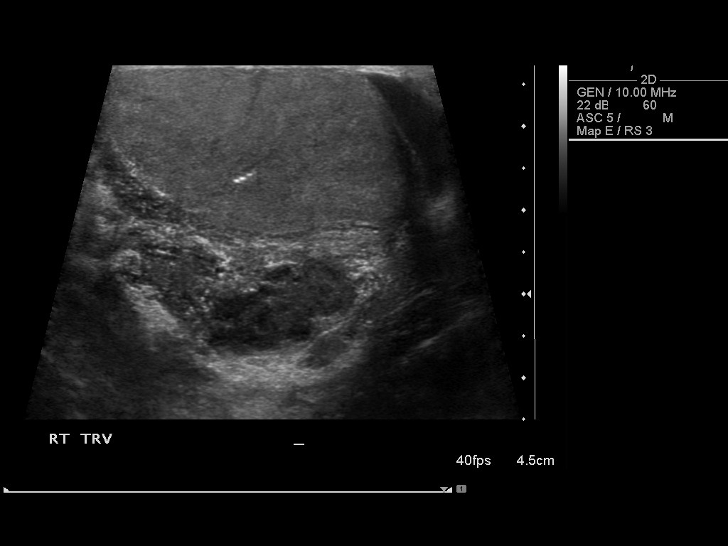
[im 42/78]
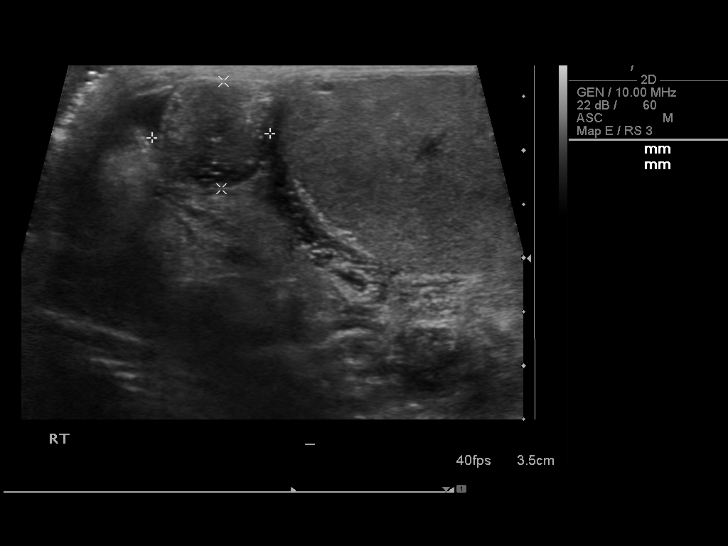
[im 49/78]
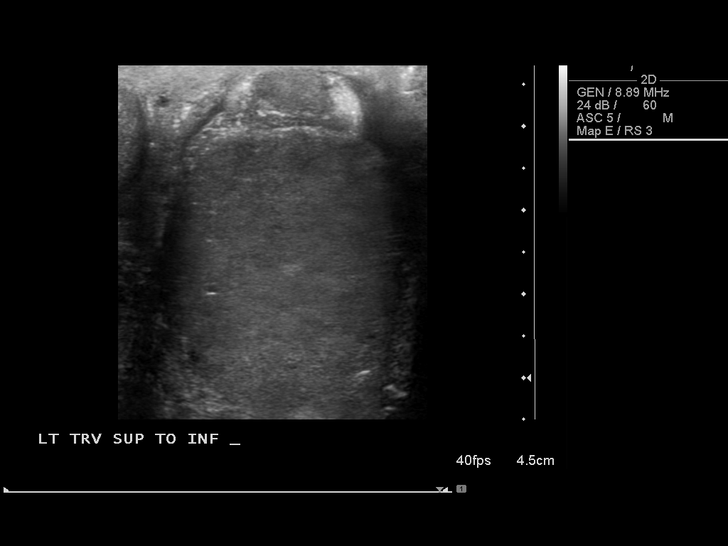
[im 52/78]
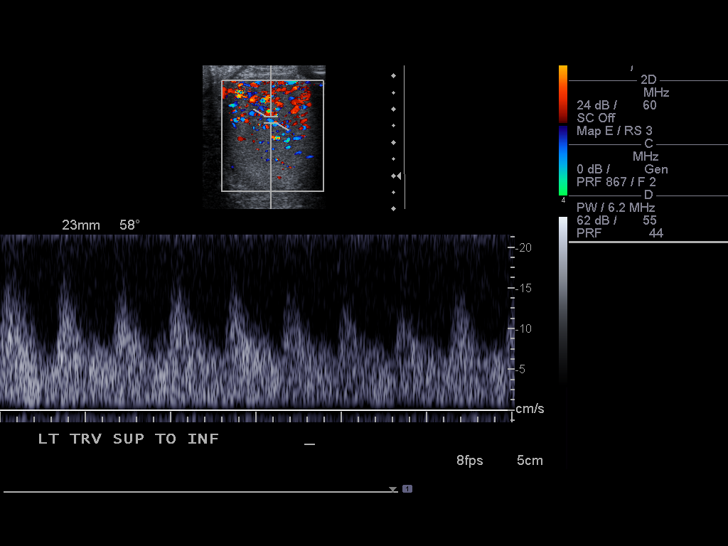
[im 58/78]
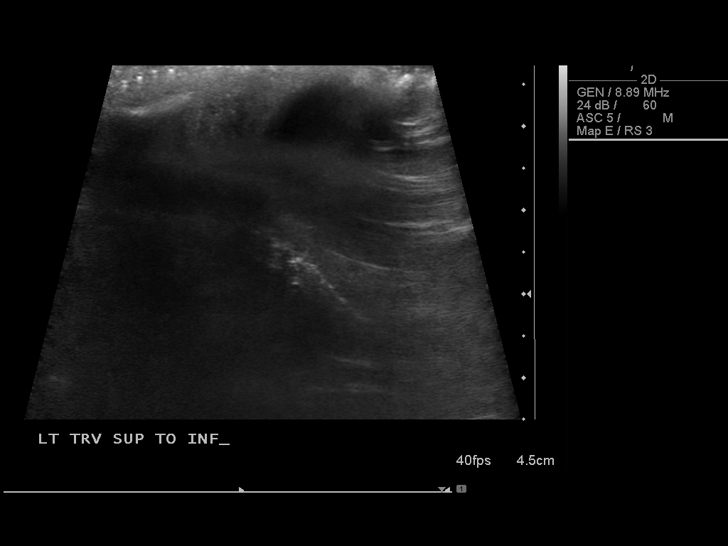
[im 65/78]
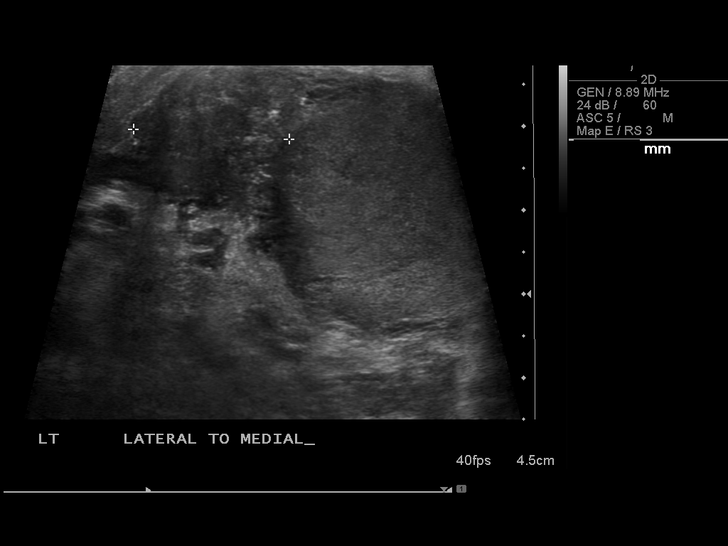
[im 71/78]
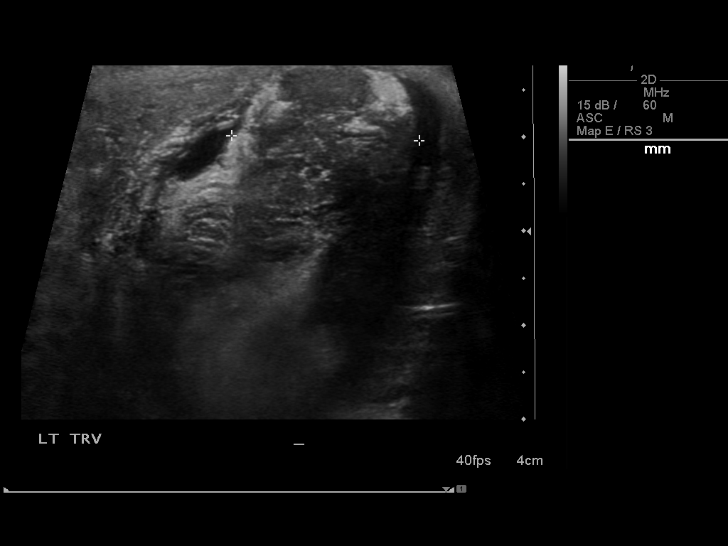
[im 78/78]
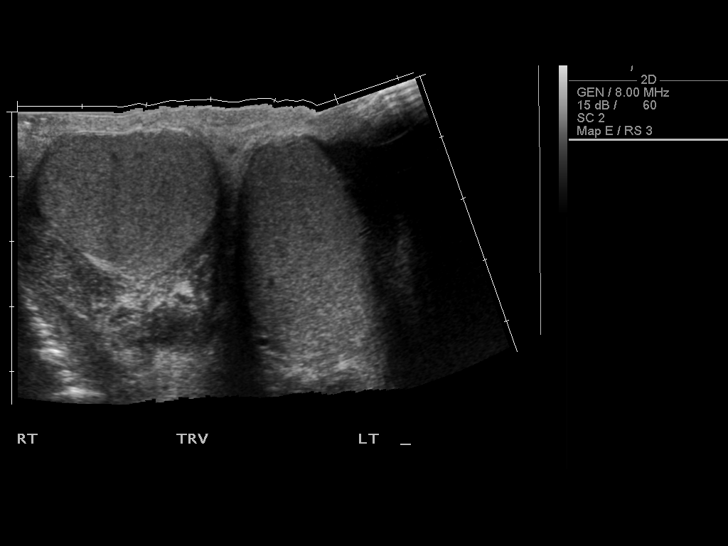

[14 of 25 positions shown; findings below may reference images not displayed]

FINDINGS: Right testis:  4.3 x 2.2 x 2.9 cm.  Increased vascularity with
microlithiasis.

Left testis:  3.9 x 2.8 x 3.0 cm.  Increased vascularity.

Right epididymis: Very prominent and heterogeneous with increased
vascularity.

Left epididymis:  Very prominent and heterogeneous with increased
vascularity.

Hydocele:  Small bilateral hydroceles.

Varicocele:  No varicoceles.

Pulsed Doppler interrogation of both testes demonstrates low
resistance flow bilaterally.

The scan of the scrotum is thickened.
IMPRESSION: Findings of bilateral epididymo-orchitis.  No abscesses at this
time.  No evidence of testicular torsion.

## 2014-05-18 ENCOUNTER — Telehealth: Payer: Self-pay | Admitting: *Deleted

## 2014-05-18 NOTE — Telephone Encounter (Signed)
Confirmed 7/6 appt with Dr. Gerrit HallsSehbai at 4:00PM with pt. No needs voiced at this time.

## 2014-05-22 ENCOUNTER — Other Ambulatory Visit: Payer: Self-pay | Admitting: Physician Assistant

## 2014-05-22 DIAGNOSIS — D619 Aplastic anemia, unspecified: Secondary | ICD-10-CM

## 2014-05-24 ENCOUNTER — Other Ambulatory Visit: Payer: Managed Care, Other (non HMO)

## 2014-05-28 ENCOUNTER — Other Ambulatory Visit: Payer: Managed Care, Other (non HMO)

## 2014-05-31 ENCOUNTER — Telehealth: Payer: Self-pay | Admitting: *Deleted

## 2014-05-31 ENCOUNTER — Ambulatory Visit: Payer: Managed Care, Other (non HMO) | Admitting: Oncology

## 2014-05-31 NOTE — Telephone Encounter (Signed)
Spoke with patient and informed him of Dr. Suan HalterSehbai's delay and confirmed new appointment for 06/13/14 at 4pm with Dr. Gerrit HallsSehbai.  Patient states he will keep his lab appointment on 06/04/14

## 2014-05-31 NOTE — Telephone Encounter (Signed)
Received call from patient stating he would rather come in on 06/14/14.  Confirmed new appointment with Dr. Gerrit HallsSehbai for [phone] at 330pm.

## 2014-06-04 ENCOUNTER — Ambulatory Visit: Payer: Managed Care, Other (non HMO) | Admitting: Oncology

## 2014-06-04 ENCOUNTER — Telehealth: Payer: Self-pay | Admitting: Hematology

## 2014-06-04 ENCOUNTER — Ambulatory Visit: Payer: Managed Care, Other (non HMO)

## 2014-06-04 ENCOUNTER — Other Ambulatory Visit (HOSPITAL_BASED_OUTPATIENT_CLINIC_OR_DEPARTMENT_OTHER): Payer: Managed Care, Other (non HMO)

## 2014-06-04 DIAGNOSIS — D619 Aplastic anemia, unspecified: Secondary | ICD-10-CM

## 2014-06-04 LAB — CBC WITH DIFFERENTIAL/PLATELET
BASO%: 0.3 % (ref 0.0–2.0)
BASOS ABS: 0 10*3/uL (ref 0.0–0.1)
EOS%: 0.9 % (ref 0.0–7.0)
Eosinophils Absolute: 0.1 10*3/uL (ref 0.0–0.5)
HEMATOCRIT: 44.6 % (ref 38.4–49.9)
HEMOGLOBIN: 15.3 g/dL (ref 13.0–17.1)
LYMPH%: 28.4 % (ref 14.0–49.0)
MCH: 32.4 pg (ref 27.2–33.4)
MCHC: 34.3 g/dL (ref 32.0–36.0)
MCV: 94.5 fL (ref 79.3–98.0)
MONO#: 0.4 10*3/uL (ref 0.1–0.9)
MONO%: 6.9 % (ref 0.0–14.0)
NEUT#: 4 10*3/uL (ref 1.5–6.5)
NEUT%: 63.5 % (ref 39.0–75.0)
PLATELETS: 232 10*3/uL (ref 140–400)
RBC: 4.72 10*6/uL (ref 4.20–5.82)
RDW: 13.2 % (ref 11.0–14.6)
WBC: 6.3 10*3/uL (ref 4.0–10.3)
lymph#: 1.8 10*3/uL (ref 0.9–3.3)

## 2014-06-04 LAB — COMPREHENSIVE METABOLIC PANEL (CC13)
ALK PHOS: 69 U/L (ref 40–150)
ALT: 18 U/L (ref 0–55)
AST: 18 U/L (ref 5–34)
Albumin: 4 g/dL (ref 3.5–5.0)
Anion Gap: 8 mEq/L (ref 3–11)
BUN: 10.5 mg/dL (ref 7.0–26.0)
CALCIUM: 9.3 mg/dL (ref 8.4–10.4)
CO2: 27 mEq/L (ref 22–29)
CREATININE: 1.3 mg/dL (ref 0.7–1.3)
Chloride: 105 mEq/L (ref 98–109)
GLUCOSE: 89 mg/dL (ref 70–140)
Potassium: 3.7 mEq/L (ref 3.5–5.1)
Sodium: 140 mEq/L (ref 136–145)
Total Bilirubin: 0.45 mg/dL (ref 0.20–1.20)
Total Protein: 7.3 g/dL (ref 6.4–8.3)

## 2014-06-04 NOTE — Telephone Encounter (Signed)
, °

## 2014-06-13 ENCOUNTER — Ambulatory Visit: Payer: Managed Care, Other (non HMO)

## 2014-06-14 ENCOUNTER — Ambulatory Visit: Payer: Managed Care, Other (non HMO)

## 2014-06-18 ENCOUNTER — Ambulatory Visit (HOSPITAL_BASED_OUTPATIENT_CLINIC_OR_DEPARTMENT_OTHER): Payer: Managed Care, Other (non HMO) | Admitting: Hematology

## 2014-06-18 ENCOUNTER — Encounter: Payer: Self-pay | Admitting: Hematology

## 2014-06-18 VITALS — BP 116/61 | HR 75 | Temp 98.4°F | Resp 18 | Ht 67.5 in | Wt 160.2 lb

## 2014-06-18 DIAGNOSIS — D613 Idiopathic aplastic anemia: Secondary | ICD-10-CM

## 2014-06-18 DIAGNOSIS — Z862 Personal history of diseases of the blood and blood-forming organs and certain disorders involving the immune mechanism: Secondary | ICD-10-CM

## 2014-06-18 NOTE — Progress Notes (Signed)
ID: Hunter Jennings   DOB: 11/08/1971  MR#: [id number]  WUJ#:[id number]  PCP: Not established yet. We gave him some Numbers of primary care physicians today.  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  INTERVAL HISTORY:  Hunter Jennings returns today for followup of his remote aplastic anemia. The interval history is generally unremarkable. He continues to work full-time at his very physical job where he walks all day. His daughters are now 3 and 7. His wife works for [employer]. His labs were reviewed today.     His blood counts have been very stable for the last few years as noticed above. There are no other constitutional symptoms. REVIEW OF SYSTEMS:  He has had no unusual headaches, visual changes, cough, phlegm production, pleurisy, change in bowel or bladder habits, chest pain or pressure, palpitations, rash, fever, or bleeding. A detailed review of systems today was entirely negative.  PAST MEDICAL HISTORY: Past Medical History  Diagnosis Date  . Aplastic anemia, unspecified   . Orchitis of both testicles may 2013    PAST SURGICAL HISTORY: No past surgical history on file.  FAMILY HISTORY No family history on file. his father died Nov 09, 2011 from complications of Alzheimer's disease. His mother is now 63 years old. He has a half-brother who is in his early 78s. He has no sisters. There is no history of cancer or other blood problems in the family.  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 His wife of 10 years, Ephriam Knuckles, works in [place]. Their 2 daughters are [place] and [place]. The patient attends [employer]   ADVANCED DIRECTIVES:  HEALTH MAINTENANCE: History  Substance Use Topics  . Smoking status: Current Some Day Smoker    Types: Cigars  . Smokeless tobacco: Not on file  . Alcohol Use: Yes     Comment: occasionally     Colonoscopy: never  PSA: none  Bone  density:none  Lipid panel: 06/06/12 noted below.  No Known Allergies  Current Outpatient Prescriptions  Medication Sig Dispense Refill  . diclofenac (VOLTAREN) 75 MG EC tablet Take 75 mg by mouth 2 (two) times daily as needed. For pain.      . Multiple Vitamin (MULITIVITAMIN WITH MINERALS) TABS Take 1 tablet by mouth daily.       No current facility-administered medications for this visit.    OBJECTIVE: Young-appearing African American male in no acute distress Filed Vitals:   06/18/14 1321  BP: 116/61  Pulse: 75  Temp: 98.4 F (36.9 C)  Resp: 18     Body mass index is 24.71 kg/(m^2).    ECOG FS: 0  Sclerae unicteric Oropharynx clear No cervical or supraclavicular adenopathy Lungs no rales or rhonchi Heart regular rate and rhythm Abd benign, no masses or hepatosplenomegaly. MSK no focal spinal tenderness, no peripheral edema Neuro: nonfocal, well oriented, pleasant affect  LAB RESULTS: Lab Results  Component Value Date   WBC 6.3 06/04/2014   NEUTROABS 4.0 06/04/2014   HGB 15.3 06/04/2014   HCT 44.6 06/04/2014   MCV 94.5 06/04/2014   PLT 232 06/04/2014      Chemistry      Component Value Date/Time   NA 140 06/04/2014 1533   NA 138 06/06/2012 0812   K 3.7 06/04/2014 1533   K 3.9 06/06/2012 0812   CL 104 05/23/2013 1608   CL 103 06/06/2012 0812   CO2 27 06/04/2014 1533  CO2 28 06/06/2012 0812   BUN 10.5 06/04/2014 1533   BUN 10 06/06/2012 0812   CREATININE 1.3 06/04/2014 1533   CREATININE 1.22 06/06/2012 0812      Component Value Date/Time   CALCIUM 9.3 06/04/2014 1533   CALCIUM 9.2 06/06/2012 0812   ALKPHOS 69 06/04/2014 1533   ALKPHOS 57 06/06/2012 0812   AST 18 06/04/2014 1533   AST 19 06/06/2012 0812   ALT 18 06/04/2014 1533   ALT 18 06/06/2012 0812   BILITOT 0.45 06/04/2014 1533   BILITOT 0.6 06/06/2012 0812       Results for Hunter Jennings, Hunter Jennings (MRN [id number]) as of 05/30/2013 16:39  Ref. Range 06/06/2012 08:12  Cholesterol Latest Range: 0-200 mg/dL 409213 (H)  Triglycerides Latest Range: <150 mg/dL 66   HDL Latest Range: >39 mg/dL 67  LDL (calc) Latest Range: 0-99 mg/dL 811133 (H)  VLDL Latest Range: 0-40 mg/dL 13  Total CHOL/HDL Ratio No range found 3.2    STUDIES:  SCROTAL ULTRASOUND  04/25/2012 DOPPLER ULTRASOUND OF THE TESTICLES  Technique: Complete ultrasound examination of the testicles, epididymis, and other scrotal structures was performed. Color and  spectral Doppler ultrasound were also utilized to evaluate blood flow to the testicles.  Comparison: None  Findings:  Right testis: 4.3 x 2.2 x 2.9 cm. Increased vascularity with microlithiasis. Left testis: 3.9 x 2.8 x 3.0 cm. Increased vascularity.  Right epididymis: Very prominent and heterogeneous with increased vascularity. Left epididymis: Very prominent and heterogeneous with increased vascularity. Hydocele: Small bilateral hydroceles. Varicocele: No varicoceles. Pulsed Doppler interrogation of both testes demonstrates low resistance flow bilaterally. The scan of the scrotum is thickened.  IMPRESSION:  Findings of bilateral epididymo-orchitis. No abscesses at this time. No evidence of testicular torsion.    ASSESSMENT: 43 y.o. with a history of aplastic anemia diagnosed March of 1994, treated with a brief course of steroids alone, in continuing remission.   PLAN:   1. Hunter Jennings looks fine and is very healthy. At this point, as he has turns [age], I think it would be a good idea for him to establish himself with her primary care physician, I will try to facilitate that for him.  2. RTC on prn basis. No need to follow CBC on an annual basis and it's time to graduate from this clinic.  3. [facility] with more Vitamin C and Zinc in it, both help bone marrow microenvironment and help with anemia.He was told to cut back on use of alcohol and fried foods.  4. Hx of Orchitis which has resolved. US 2013 reviewed.  He knows to call for any problems as he is well known in this cancer center.   Cay SchillingsAasim Dabria Wadas, MD Medical  Hematologist/Oncologist [facility] Pager: [phone] Office No: [phone]   06/18/2014

## 2014-06-20 ENCOUNTER — Telehealth: Payer: Self-pay | Admitting: Hematology

## 2014-06-20 NOTE — Telephone Encounter (Signed)
Per 7/20 pof pt can return PRN. No appts orderd. No appts made.

## 2015-05-24 ENCOUNTER — Other Ambulatory Visit: Payer: Self-pay

## 2015-05-24 DIAGNOSIS — D6489 Other specified anemias: Secondary | ICD-10-CM

## 2015-05-27 ENCOUNTER — Other Ambulatory Visit: Payer: Managed Care, Other (non HMO)

## 2015-05-27 ENCOUNTER — Ambulatory Visit: Payer: Managed Care, Other (non HMO) | Admitting: Oncology

## 2015-08-15 ENCOUNTER — Telehealth: Payer: Self-pay | Admitting: Oncology

## 2015-08-15 NOTE — Telephone Encounter (Signed)
Returned Engineer, technical sales. Confirmed appointment for 10/04. Will call back if needs to cancel

## 2015-09-03 ENCOUNTER — Ambulatory Visit: Payer: Managed Care, Other (non HMO) | Admitting: Oncology

## 2015-09-03 ENCOUNTER — Other Ambulatory Visit: Payer: Managed Care, Other (non HMO)

## 2015-09-03 ENCOUNTER — Telehealth: Payer: Self-pay | Admitting: Oncology

## 2015-09-03 NOTE — Telephone Encounter (Signed)
pateint aclled in and left a message to cancel todays appt  Hunter Jennings

## 2020-01-23 ENCOUNTER — Telehealth: Payer: Self-pay

## 2020-01-23 NOTE — Telephone Encounter (Signed)
Ok with me 

## 2020-01-23 NOTE — Telephone Encounter (Signed)
Please advise 

## 2020-01-23 NOTE — Telephone Encounter (Signed)
New message   The mother Campbell Blas calling stating Dr. Jonny Ruiz would take her son on as a new patient this was discussed at mom's visit last year 8/20.    Please advise.

## 2020-01-24 NOTE — Telephone Encounter (Signed)
Called patients mom Shirlene and told her Dr. Jonny Ruiz will accept her son as a patient. Patient will call back to schedule

## 2020-03-16 ENCOUNTER — Ambulatory Visit: Payer: Managed Care, Other (non HMO) | Attending: Internal Medicine

## 2020-03-16 DIAGNOSIS — Z23 Encounter for immunization: Secondary | ICD-10-CM

## 2020-03-16 NOTE — Progress Notes (Signed)
   Covid-19 Vaccination Clinic  Name:  Hunter Jennings    MRN: [id number] DOB: 1971/05/26  03/16/2020  Mr. Hunter Jennings was observed post Covid-19 immunization for 15 minutes without incident. He was provided with Vaccine Information Sheet and instruction to access the V-Safe system.   Mr. Hunter Jennings was instructed to call 911 with any severe reactions post vaccine: Marland Kitchen Difficulty breathing  . Swelling of face and throat  . A fast heartbeat  . A bad rash all over body  . Dizziness and weakness   Immunizations Administered    Name Date Dose VIS Date Route   Pfizer COVID-19 Vaccine 03/16/2020  8:30 AM 0.3 mL 11/10/2019 Intramuscular   Manufacturer: [employer], [employer]   Lot: [id number]   NDC: [address]-0

## 2020-04-09 ENCOUNTER — Ambulatory Visit: Payer: Managed Care, Other (non HMO) | Attending: Internal Medicine

## 2020-04-09 DIAGNOSIS — Z23 Encounter for immunization: Secondary | ICD-10-CM

## 2020-04-09 NOTE — Progress Notes (Signed)
   Covid-19 Vaccination Clinic  Name:  Hunter Jennings    MRN: [id number] DOB: 07-Jan-1971  04/09/2020  Mr. Hunter Jennings was observed post Covid-19 immunization for 15 minutes without incident. He was provided with Vaccine Information Sheet and instruction to access the V-Safe system.   Mr. Hunter Jennings was instructed to call 911 with any severe reactions post vaccine: Marland Kitchen Difficulty breathing  . Swelling of face and throat  . A fast heartbeat  . A bad rash all over body  . Dizziness and weakness   Immunizations Administered    Name Date Dose VIS Date Route   Pfizer COVID-19 Vaccine 04/09/2020  9:00 AM 0.3 mL 01/24/2019 Intramuscular   Manufacturer: [employer], [employer]   Lot: [id number]   NDC: [address]-4

## 2020-07-04 ENCOUNTER — Ambulatory Visit: Payer: Managed Care, Other (non HMO) | Admitting: Internal Medicine

## 2020-07-18 ENCOUNTER — Other Ambulatory Visit: Payer: Self-pay

## 2020-07-18 ENCOUNTER — Ambulatory Visit (INDEPENDENT_AMBULATORY_CARE_PROVIDER_SITE_OTHER): Payer: Managed Care, Other (non HMO) | Admitting: Internal Medicine

## 2020-07-18 ENCOUNTER — Encounter: Payer: Self-pay | Admitting: Internal Medicine

## 2020-07-18 VITALS — BP 130/80 | HR 93 | Temp 98.4°F | Ht 67.5 in | Wt 158.0 lb

## 2020-07-18 DIAGNOSIS — E538 Deficiency of other specified B group vitamins: Secondary | ICD-10-CM

## 2020-07-18 DIAGNOSIS — M25522 Pain in left elbow: Secondary | ICD-10-CM

## 2020-07-18 DIAGNOSIS — Z114 Encounter for screening for human immunodeficiency virus [HIV]: Secondary | ICD-10-CM

## 2020-07-18 DIAGNOSIS — Z1159 Encounter for screening for other viral diseases: Secondary | ICD-10-CM | POA: Diagnosis not present

## 2020-07-18 DIAGNOSIS — E785 Hyperlipidemia, unspecified: Secondary | ICD-10-CM | POA: Diagnosis not present

## 2020-07-18 DIAGNOSIS — E559 Vitamin D deficiency, unspecified: Secondary | ICD-10-CM

## 2020-07-18 DIAGNOSIS — Z0000 Encounter for general adult medical examination without abnormal findings: Secondary | ICD-10-CM | POA: Diagnosis not present

## 2020-07-18 NOTE — Patient Instructions (Signed)
Please take all new medication as recommended - the OTC Voltaren gel as needed for the left elbow  Please consider seeing Sports Medicine on the first floor if the elbow gets worse  Please continue all other medications as before, and refills have been done if requested.  Please have the pharmacy call with any other refills you may need.  Please continue your efforts at being more active, low cholesterol diet, and weight control.  You are otherwise up to date with prevention measures today.  Please keep your appointments with your specialists as you may have planned  Please go to the LAB at the blood drawing area for the tests to be done  You will be contacted by phone if any changes need to be made immediately.  Otherwise, you will receive a letter about your results with an explanation, but please check with MyChart first.  Please remember to sign up for MyChart if you have not done so, as this will be important to you in the future with finding out test results, communicating by private email, and scheduling acute appointments online when needed.  Please make an Appointment to return for your 1 year visit, or sooner if needed

## 2020-07-18 NOTE — Progress Notes (Signed)
Subjective:    Patient ID: Hunter Jennings, male    DOB: 12-26-1970, 49 y.o.   MRN: [id number]  HPI  Here for wellness and f/u;  Overall doing ok;  Pt denies Chest pain, worsening SOB, DOE, wheezing, orthopnea, PND, worsening LE edema, palpitations, dizziness or syncope.  Pt denies neurological change such as new headache, facial or extremity weakness.  Pt denies polydipsia, polyuria, or low sugar symptoms. Pt states overall good compliance with treatment and medications, good tolerability, and has been trying to follow appropriate diet.  Pt denies worsening depressive symptoms, suicidal ideation or panic. No fever, night sweats, wt loss, loss of appetite, or other constitutional symptoms.  Pt states good ability with ADL's, has low fall risk, home safety reviewed and adequate, no other significant changes in hearing or vision, and only occasionally active with exercise.  No new complaints except for tender extensor area left elbow tendon with flexion Past Medical History:  Diagnosis Date  . Aplastic anemia, unspecified (HCC)   . Orchitis of both testicles may 2013   History reviewed. No pertinent surgical history.  reports that he has been smoking cigars. He does not have any smokeless tobacco history on file. He reports current alcohol use. No history on file for drug use. family history is not on file. No Known Allergies Current Outpatient Medications on File Prior to Visit  Medication Sig Dispense Refill  . benzonatate (TESSALON) 200 MG capsule benzonatate 200 mg capsule  Take 1 capsule 3 times a day by oral route as needed.    . diclofenac (VOLTAREN) 75 MG EC tablet Take 75 mg by mouth 2 (two) times daily as needed. For pain.    . Multiple Vitamin (MULITIVITAMIN WITH MINERALS) TABS Take 1 tablet by mouth daily.     No current facility-administered medications on file prior to visit.   Review of Systems All otherwise neg per pt    Objective:   Physical Exam BP 130/80 (BP Location:  Left Arm, Patient Position: Sitting, Cuff Size: Large)   Pulse 93   Temp 98.4 F (36.9 C) (Oral)   Ht 5' 7.5" (1.715 m)   Wt 158 lb (71.7 kg)   SpO2 97%   BMI 24.38 kg/m  VS noted,  Constitutional: Pt appears in NAD HENT: Head: NCAT.  Right Ear: External ear normal.  Left Ear: External ear normal.  Eyes: . Pupils are equal, round, and reactive to light. Conjunctivae and EOM are normal Nose: without d/c or deformity Neck: Neck supple. Gross normal ROM Cardiovascular: Normal rate and regular rhythm.   Pulmonary/Chest: Effort normal and breath sounds without rales or wheezing.  Abd:  Soft, NT, ND, + BS, no organomegaly Neurological: Pt is alert. At baseline orientation, motor grossly intact Skin: Skin is warm. No rashes, other new lesions, no LE edema Psychiatric: Pt behavior is normal without agitation  All otherwise neg per pt Lab Results  Component Value Date   WBC 6.1 07/18/2020   HGB 16.0 07/18/2020   HCT 46.6 07/18/2020   PLT 292 07/18/2020   GLUCOSE 88 07/18/2020   CHOL 214 (H) 07/18/2020   TRIG 49 07/18/2020   HDL 68 07/18/2020   LDLCALC 131 (H) 07/18/2020   ALT 23 07/18/2020   AST 19 07/18/2020   NA 139 07/18/2020   K 3.8 07/18/2020   CL 104 07/18/2020   CREATININE 1.40 (H) 07/18/2020   BUN 10 07/18/2020   CO2 27 07/18/2020   TSH 0.42 07/18/2020   PSA  0.6 07/18/2020      Assessment & Plan:

## 2020-07-19 ENCOUNTER — Encounter: Payer: Self-pay | Admitting: Internal Medicine

## 2020-07-19 LAB — CBC WITH DIFFERENTIAL/PLATELET
Absolute Monocytes: 433 cells/uL (ref 200–950)
Basophils Absolute: 31 cells/uL (ref 0–200)
Basophils Relative: 0.5 %
Eosinophils Absolute: 18 cells/uL (ref 15–500)
Eosinophils Relative: 0.3 %
HCT: 46.6 % (ref 38.5–50.0)
Hemoglobin: 16 g/dL (ref 13.2–17.1)
Lymphs Abs: 1409 cells/uL (ref 850–3900)
MCH: 32.7 pg (ref 27.0–33.0)
MCHC: 34.3 g/dL (ref 32.0–36.0)
MCV: 95.3 fL (ref 80.0–100.0)
MPV: 8.7 fL (ref 7.5–12.5)
Monocytes Relative: 7.1 %
Neutro Abs: 4209 cells/uL (ref 1500–7800)
Neutrophils Relative %: 69 %
Platelets: 292 10*3/uL (ref 140–400)
RBC: 4.89 10*6/uL (ref 4.20–5.80)
RDW: 12.3 % (ref 11.0–15.0)
Total Lymphocyte: 23.1 %
WBC: 6.1 10*3/uL (ref 3.8–10.8)

## 2020-07-19 LAB — COMPLETE METABOLIC PANEL WITH GFR
AG Ratio: 1.7 (calc) (ref 1.0–2.5)
ALT: 23 U/L (ref 9–46)
AST: 19 U/L (ref 10–40)
Albumin: 4.4 g/dL (ref 3.6–5.1)
Alkaline phosphatase (APISO): 78 U/L (ref 36–130)
BUN/Creatinine Ratio: 7 (calc) (ref 6–22)
BUN: 10 mg/dL (ref 7–25)
CO2: 27 mmol/L (ref 20–32)
Calcium: 9.5 mg/dL (ref 8.6–10.3)
Chloride: 104 mmol/L (ref 98–110)
Creat: 1.4 mg/dL — ABNORMAL HIGH (ref 0.60–1.35)
GFR, Est African American: 69 mL/min/{1.73_m2} (ref >=60)
GFR, Est Non African American: 59 mL/min/{1.73_m2} — ABNORMAL LOW (ref >=60)
Globulin: 2.6 g/dL (calc) (ref 1.9–3.7)
Glucose, Bld: 88 mg/dL (ref 65–99)
Potassium: 3.8 mmol/L (ref 3.5–5.3)
Sodium: 139 mmol/L (ref 135–146)
Total Bilirubin: 0.5 mg/dL (ref 0.2–1.2)
Total Protein: 7 g/dL (ref 6.1–8.1)

## 2020-07-19 LAB — LIPID PANEL
Cholesterol: 214 mg/dL — ABNORMAL HIGH (ref <200)
HDL: 68 mg/dL (ref >=40)
LDL Cholesterol (Calc): 131 mg/dL (calc) — ABNORMAL HIGH
Non-HDL Cholesterol (Calc): 146 mg/dL (calc) — ABNORMAL HIGH (ref <130)
Total CHOL/HDL Ratio: 3.1 (calc) (ref <5.0)
Triglycerides: 49 mg/dL (ref <150)

## 2020-07-19 LAB — URINALYSIS, ROUTINE W REFLEX MICROSCOPIC
Bilirubin Urine: NEGATIVE
Glucose, UA: NEGATIVE
Hgb urine dipstick: NEGATIVE
Ketones, ur: NEGATIVE
Leukocytes,Ua: NEGATIVE
Nitrite: NEGATIVE
Protein, ur: NEGATIVE
Specific Gravity, Urine: 1.021 (ref 1.001–1.03)
pH: 6.5 (ref 5.0–8.0)

## 2020-07-19 LAB — PSA: PSA: 0.6 ng/mL (ref <=4.0)

## 2020-07-19 LAB — HEPATITIS C ANTIBODY
Hepatitis C Ab: NONREACTIVE
SIGNAL TO CUT-OFF: 0.02 (ref <1.00)

## 2020-07-19 LAB — HIV ANTIBODY (ROUTINE TESTING W REFLEX): HIV 1&2 Ab, 4th Generation: NONREACTIVE

## 2020-07-19 LAB — VITAMIN D 25 HYDROXY (VIT D DEFICIENCY, FRACTURES): Vit D, 25-Hydroxy: 20 ng/mL — ABNORMAL LOW (ref 30–100)

## 2020-07-19 LAB — VITAMIN B12: Vitamin B-12: 709 pg/mL (ref 200–1100)

## 2020-07-19 LAB — TSH: TSH: 0.42 mIU/L (ref 0.40–4.50)

## 2020-07-20 ENCOUNTER — Encounter: Payer: Self-pay | Admitting: Internal Medicine

## 2020-07-20 DIAGNOSIS — M25522 Pain in left elbow: Secondary | ICD-10-CM | POA: Insufficient documentation

## 2020-07-20 NOTE — Assessment & Plan Note (Signed)
Consider statin but declines for now, for low chol diet

## 2020-07-20 NOTE — Assessment & Plan Note (Signed)
Mild, for otc volt gel, consider sport med referral if persists or worsens

## 2020-07-20 NOTE — Assessment & Plan Note (Signed)
